# Patient Record
Sex: Male | Born: 1995 | Hispanic: Yes | Marital: Single | Smoking: Current every day smoker
Health system: Southern US, Community
[De-identification: ages and names within clinical notes are randomized; demographics above are authoritative.]

## PROBLEM LIST (undated history)

## (undated) DIAGNOSIS — J45909 Unspecified asthma, uncomplicated: Secondary | ICD-10-CM

---
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
## 2019-01-16 ENCOUNTER — Emergency Department
Admission: EM | Admit: 2019-01-16 | Discharge: 2019-01-16 | Disposition: A | Discharge status: Home / Self Care | Payer: Medicaid Other | Attending: Emergency Medicine | Admitting: Emergency Medicine

## 2019-01-16 ENCOUNTER — Other Ambulatory Visit: Payer: Self-pay

## 2019-01-16 ENCOUNTER — Emergency Department: Payer: Medicaid Other

## 2019-01-16 ENCOUNTER — Encounter: Payer: Self-pay | Admitting: Emergency Medicine

## 2019-01-16 DIAGNOSIS — R1010 Upper abdominal pain, unspecified: Secondary | ICD-10-CM

## 2019-01-16 DIAGNOSIS — R1013 Epigastric pain: Secondary | ICD-10-CM | POA: Diagnosis present

## 2019-01-16 DIAGNOSIS — K2900 Acute gastritis without bleeding: Secondary | ICD-10-CM | POA: Insufficient documentation

## 2019-01-16 DIAGNOSIS — J45909 Unspecified asthma, uncomplicated: Secondary | ICD-10-CM | POA: Diagnosis not present

## 2019-01-16 HISTORY — DX: Unspecified asthma, uncomplicated: J45.909

## 2019-01-16 LAB — LIPASE, BLOOD: Lipase: 22 U/L (ref 11–51)

## 2019-01-16 LAB — CBC
HCT: 47.5 % (ref 39.0–52.0)
Hemoglobin: 16.2 g/dL (ref 13.0–17.0)
MCH: 29 pg (ref 26.0–34.0)
MCHC: 34.1 g/dL (ref 30.0–36.0)
MCV: 85 fL (ref 80.0–100.0)
Platelets: 303 10*3/uL (ref 150–400)
RBC: 5.59 MIL/uL (ref 4.22–5.81)
RDW: 12.8 % (ref 11.5–15.5)
WBC: 10.8 10*3/uL — ABNORMAL HIGH (ref 4.0–10.5)
nRBC: 0 % (ref 0.0–0.2)

## 2019-01-16 LAB — COMPREHENSIVE METABOLIC PANEL
ALT: 74 U/L — ABNORMAL HIGH (ref 0–44)
AST: 57 U/L — ABNORMAL HIGH (ref 15–41)
Albumin: 4.9 g/dL (ref 3.5–5.0)
Alkaline Phosphatase: 93 U/L (ref 38–126)
Anion gap: 13 (ref 5–15)
BUN: 16 mg/dL (ref 6–20)
CO2: 22 mmol/L (ref 22–32)
Calcium: 9.4 mg/dL (ref 8.9–10.3)
Chloride: 101 mmol/L (ref 98–111)
Creatinine, Ser: 0.93 mg/dL (ref 0.61–1.24)
GFR calc Af Amer: >60 mL/min (ref >60)
GFR calc non Af Amer: >60 mL/min (ref >60)
Glucose, Bld: 133 mg/dL — ABNORMAL HIGH (ref 70–99)
Potassium: 3.6 mmol/L (ref 3.5–5.1)
Sodium: 136 mmol/L (ref 135–145)
Total Bilirubin: 1.6 mg/dL — ABNORMAL HIGH (ref 0.3–1.2)
Total Protein: 8.8 g/dL — ABNORMAL HIGH (ref 6.5–8.1)

## 2019-01-16 MED ORDER — MORPHINE SULFATE (PF) 2 MG/ML IV SOLN
INTRAVENOUS | Status: AC
  Filled 2019-01-16: qty 2

## 2019-01-16 MED ORDER — ONDANSETRON 4 MG PO TBDP: 4.0000 mg | ORAL_TABLET | Freq: Three times a day (TID) | ORAL | 0 refills | Status: AC | PRN

## 2019-01-16 MED ORDER — ONDANSETRON HCL 4 MG/2ML IJ SOLN
4.0000 mg | Freq: Once | INTRAMUSCULAR | Status: AC
  Administered 2019-01-16: 4 mg via INTRAVENOUS
  Filled 2019-01-16: qty 2

## 2019-01-16 MED ORDER — MORPHINE SULFATE (PF) 4 MG/ML IV SOLN
4.0000 mg | Freq: Once | INTRAVENOUS | Status: AC
  Administered 2019-01-16: 4 mg via INTRAVENOUS

## 2019-01-16 NOTE — ED Triage Notes (Signed)
Pt reports RUQ and LUQ pain, vomiting, headache since yesterday. Reports decreased appetite, also reports bilateral calf cramps. Denies diarrhea.

## 2019-01-16 NOTE — ED Notes (Signed)
AAOx3.  Skin warm and dry.  nad 

## 2019-01-16 NOTE — ED Provider Notes (Signed)
[facility] Emergency Department Provider Note   ____________________________________________    I have reviewed the triage vital signs and the nursing notes.   HISTORY  Chief Complaint Abdominal pain   HPI Dylan Snow is a 23 y.o. male who presents with complaints of burning/cramping moderate abdominal pain, primarily in the epigastrium which started yesterday.  He reports he had nausea and vomiting as well.  No diarrhea.  No fevers or chills.  No cough or shortness of breath.  Has never had this before.  Denies  recent alcohol ingestion.  No history of abdominal surgeries.  Does have a history of asthma.  Has not take anything for this.  Past Medical History:  Diagnosis Date  . Asthma     There are no active problems to display for this patient.     Prior to Admission medications   Medication Sig Start Date End Date Taking? Authorizing Provider  ondansetron (ZOFRAN ODT) 4 MG disintegrating tablet Take 1 tablet (4 mg total) by mouth every 8 (eight) hours as needed. 01/16/19   Jene EveryKinner, Chealsey Miyamoto, MD     Allergies Patient has no known allergies.  No family history on file.  Social History Occasional EtOH, no smoking Review of Systems  Constitutional: No fever/chills Eyes: No visual changes.  ENT: No sore throat. Cardiovascular: Denies chest pain. Respiratory: Denies shortness of breath. Gastrointestinal: As above Genitourinary: Negative for dysuria. Musculoskeletal: Negative for back pain. Skin: Negative for rash. Neurological: Negative for headaches or weakness   ____________________________________________   PHYSICAL EXAM:  VITAL SIGNS: ED Triage Vitals  Enc Vitals Group     BP 01/16/19 0916 (!) 155/74     Pulse Rate 01/16/19 0916 80     Resp 01/16/19 0916 15     Temp 01/16/19 0916 98.7 F (37.1 C)     Temp Source 01/16/19 0916 Oral     SpO2 01/16/19 0916 96 %     Weight 01/16/19 0917 99.8 kg (220 lb)     Height  01/16/19 0917 1.676 m (5\' 6" )     Head Circumference --      Peak Flow --      Pain Score 01/16/19 0916 4     Pain Loc --      Pain Edu? --      Excl. in GC? --     Constitutional: Alert and oriented.  Eyes: Conjunctivae are normal.   Nose: No congestion/rhinnorhea. Mouth/Throat: Mucous membranes are moist.   Cardiovascular: Normal rate, regular rhythm. Grossly normal heart sounds.  Good peripheral circulation. Respiratory: Normal respiratory effort.  No retractions. Lungs CTAB. Gastrointestinal: Mild tenderness to the right upper quadrant, no distention, no CVA tenderness Musculoskeletal: Warm and well perfused Neurologic:  Normal speech and language. No gross focal neurologic deficits are appreciated.  Skin:  Skin is warm, dry and intact. No rash noted. Psychiatric: Mood and affect are normal. Speech and behavior are normal.  ____________________________________________   LABS (all labs ordered are listed, but only abnormal results are displayed)  Labs Reviewed  COMPREHENSIVE METABOLIC PANEL - Abnormal; Notable for the following components:      Result Value   Glucose, Bld 133 (*)    Total Protein 8.8 (*)    AST 57 (*)    ALT 74 (*)    Total Bilirubin 1.6 (*)    All other components within normal limits  CBC - Abnormal; Notable for the following components:   WBC 10.8 (*)  All other components within normal limits  LIPASE, BLOOD  URINALYSIS, COMPLETE (UACMP) WITH MICROSCOPIC   ____________________________________________  EKG  ED ECG REPORT I, Lavonia Drafts, the attending physician, personally viewed and interpreted this ECG.  Date: 01/16/2019  Rhythm: normal sinus rhythm QRS Axis: normal Intervals: normal ST/T Wave abnormalities: normal Narrative Interpretation: no evidence of acute ischemia  ____________________________________________  RADIOLOGY  Ultrasound right upper quadrant no acute abnormality ____________________________________________    PROCEDURES  Procedure(s) performed: No  Procedures   Critical Care performed: No ____________________________________________   INITIAL IMPRESSION / ASSESSMENT AND PLAN / ED COURSE  Pertinent labs & imaging results that were available during my care of the patient were reviewed by me and considered in my medical decision making (see chart for details).  Patient presents with epigastric abdominal pain, mild tenderness in the right upper quadrant, differential includes biliary colic/cholecystitis, pancreatitis, gastritis..  Will treat with IV morphine, IV Zofran, obtain labs, ultrasound of the right upper quadrant and reevaluate.  Patient had significant provement after IV morphine, ultrasound is overall unremarkable, lab work is reassuring, he does have mild elevations in LFTs but admits to frequent alcohol use.  Counseled him on alcohol cessation.  Outpatient follow-up with PCP.    ____________________________________________   FINAL CLINICAL IMPRESSION(S) / ED DIAGNOSES  Final diagnoses:  Upper abdominal pain  Acute gastritis without hemorrhage, unspecified gastritis type        Note:  This document was prepared using Dragon voice recognition software and may include unintentional dictation errors.   Lavonia Drafts, MD 01/16/19 1308

## 2020-04-15 ENCOUNTER — Telehealth: Payer: Self-pay | Admitting: Emergency Medicine

## 2020-04-15 ENCOUNTER — Other Ambulatory Visit: Payer: Self-pay

## 2020-04-15 ENCOUNTER — Emergency Department
Admission: EM | Admit: 2020-04-15 | Discharge: 2020-04-15 | Disposition: A | Discharge status: Home / Self Care | Payer: Medicaid Other | Attending: Emergency Medicine | Admitting: Emergency Medicine

## 2020-04-15 ENCOUNTER — Encounter: Payer: Self-pay | Admitting: Emergency Medicine

## 2020-04-15 DIAGNOSIS — N50819 Testicular pain, unspecified: Secondary | ICD-10-CM | POA: Diagnosis present

## 2020-04-15 DIAGNOSIS — F17200 Nicotine dependence, unspecified, uncomplicated: Secondary | ICD-10-CM | POA: Diagnosis not present

## 2020-04-15 DIAGNOSIS — J45909 Unspecified asthma, uncomplicated: Secondary | ICD-10-CM | POA: Insufficient documentation

## 2020-04-15 DIAGNOSIS — A64 Unspecified sexually transmitted disease: Secondary | ICD-10-CM

## 2020-04-15 LAB — URINALYSIS, COMPLETE (UACMP) WITH MICROSCOPIC
Bacteria, UA: NONE SEEN
Bilirubin Urine: NEGATIVE
Glucose, UA: NEGATIVE mg/dL
Hgb urine dipstick: NEGATIVE
Ketones, ur: NEGATIVE mg/dL
Nitrite: NEGATIVE
Protein, ur: NEGATIVE mg/dL
Specific Gravity, Urine: 1.02 (ref 1.005–1.030)
Squamous Epithelial / HPF: NONE SEEN (ref 0–5)
pH: 5 (ref 5.0–8.0)

## 2020-04-15 LAB — CHLAMYDIA/NGC RT PCR (ARMC ONLY)
Chlamydia Tr: DETECTED — AB
N gonorrhoeae: NOT DETECTED

## 2020-04-15 MED ORDER — CEFTRIAXONE SODIUM 1 G IJ SOLR
500.0000 mg | Freq: Once | INTRAMUSCULAR | Status: AC
  Administered 2020-04-15: 500 mg via INTRAMUSCULAR
  Filled 2020-04-15: qty 10

## 2020-04-15 MED ORDER — DOXYCYCLINE HYCLATE 100 MG PO TABS
100.0000 mg | ORAL_TABLET | Freq: Once | ORAL | Status: AC
  Administered 2020-04-15: 100 mg via ORAL
  Filled 2020-04-15: qty 1

## 2020-04-15 MED ORDER — DOXYCYCLINE HYCLATE 100 MG PO TABS: 100.0000 mg | ORAL_TABLET | Freq: Two times a day (BID) | ORAL | 0 refills | Status: AC

## 2020-04-15 NOTE — ED Notes (Signed)
Patient declined discharge vital signs. 

## 2020-04-15 NOTE — ED Provider Notes (Signed)
[facility] Emergency Department Provider Note   ____________________________________________    I have reviewed the triage vital signs and the nursing notes.   HISTORY  Chief Complaint Abscess and Testicle Pain     HPI Dylan Snow is a 24 y.o. male who presents with complaints of dysuria and mild left testicular pain.  Patient reports he thought it could be from "partying "too much and drinking too much alcohol so he has started drinking cranberry juice.  No scrotal swelling, he reports a small area of tenderness on his left testicle.  No fevers chills nausea or vomiting  Past Medical History:  Diagnosis Date  . Asthma     There are no problems to display for this patient.   History reviewed. No pertinent surgical history.  Prior to Admission medications   Medication Sig Start Date End Date Taking? Authorizing Provider  doxycycline (VIBRA-TABS) 100 MG tablet Take 1 tablet (100 mg total) by mouth 2 (two) times daily. 04/15/20   Jene Every, MD  ondansetron (ZOFRAN ODT) 4 MG disintegrating tablet Take 1 tablet (4 mg total) by mouth every 8 (eight) hours as needed. 01/16/19   Jene Every, MD     Allergies Patient has no known allergies.  No family history on file.  Social History Social History   Tobacco Use  . Smoking status: Current Every Day Smoker  . Smokeless tobacco: Never Used  Substance Use Topics  . Alcohol use: Yes    Comment: not in 2 weeks  . Drug use: Not on file    Review of Systems  Constitutional: No fever/chills Eyes: No visual changes.  ENT: No sore throat. Cardiovascular: Denies chest pain. Respiratory: Denies shortness of breath. Gastrointestinal: As above Genitourinary: Penile discharge, dysuria as above Musculoskeletal: Negative for back pain. Skin: Negative for rash. Neurological: Negative for headaches    ____________________________________________   PHYSICAL EXAM:  VITAL SIGNS: ED  Triage Vitals  Enc Vitals Group     BP 04/15/20 1136 (!) 115/51     Pulse Rate 04/15/20 1136 63     Resp 04/15/20 1136 14     Temp 04/15/20 1136 98.4 F (36.9 C)     Temp Source 04/15/20 1136 Oral     SpO2 04/15/20 1136 97 %     Weight 04/15/20 1137 102.1 kg (225 lb)     Height 04/15/20 1137 1.676 m (5\' 6" )     Head Circumference --      Peak Flow --      Pain Score 04/15/20 1137 8     Pain Loc --      Pain Edu? --      Excl. in GC? --     Constitutional: Alert and oriented. No acute distress. Pleasant and interactive   Mouth/Throat: Mucous membranes are moist.    Cardiovascular: Normal rate, regular rhythm.   Good peripheral circulation. Respiratory: Normal respiratory effort.  No retractions Gastrointestinal: Soft and nontender. No distention.  Genitourinary: Mildly tender epididymis on the left, no scrotal swelling, positive penile discharge Musculoskeletal:   Warm and well perfused Neurologic:  Normal speech and language. No gross focal neurologic deficits are appreciated.  Skin:  Skin is warm, dry and intact. No rash noted. Psychiatric: Mood and affect are normal. Speech and behavior are normal.  ____________________________________________   LABS (all labs ordered are listed, but only abnormal results are displayed)  Labs Reviewed  CHLAMYDIA/NGC RT PCR ([facility] ONLY)  URINALYSIS, COMPLETE (UACMP) WITH MICROSCOPIC  ____________________________________________  EKG  None ____________________________________________  RADIOLOGY  None ____________________________________________   PROCEDURES  Procedure(s) performed: No  Procedures   Critical Care performed: No ____________________________________________   INITIAL IMPRESSION / ASSESSMENT AND PLAN / ED COURSE  Pertinent labs & imaging results that were available during my care of the patient were reviewed by me and considered in my medical decision making (see chart for details).  Patient  presents with penile discharge, dysuria, likely mild epididymitis on the left consistent with sexually transmitted disease, will treat with IM Rocephin, p.o. doxy, no sexual activity x1 week, notify sexual partners.    ____________________________________________   FINAL CLINICAL IMPRESSION(S) / ED DIAGNOSES  Final diagnoses:  STD (male)        Note:  This document was prepared using Dragon voice recognition software and may include unintentional dictation errors.   Jene Every, MD 04/15/20 1302

## 2020-04-15 NOTE — Telephone Encounter (Signed)
Called patient to inform of std test positive for chlamydia.  No answer and no voicemail.

## 2020-04-15 NOTE — ED Triage Notes (Signed)
Says he has left groin/testicle pian and it has a bump on it.  Started last night.

## 2020-04-18 NOTE — Telephone Encounter (Signed)
Called patient again to give std results.  Again, no answer and no voicemail.  Will send letter.

## 2020-07-24 IMAGING — US ULTRASOUND ABDOMEN LIMITED
1 series · 14 of 25 positions shown · non-contrast
Comparison: None.

CLINICAL DATA: RIGHT upper quadrant pain and vomiting since
yesterday.

EXAM:
ULTRASOUND ABDOMEN LIMITED RIGHT UPPER QUADRANT

[Series 1: ultrasound abdomen limited · 0.26mm/px · 14 of 38 slices shown]
[im 1/38]
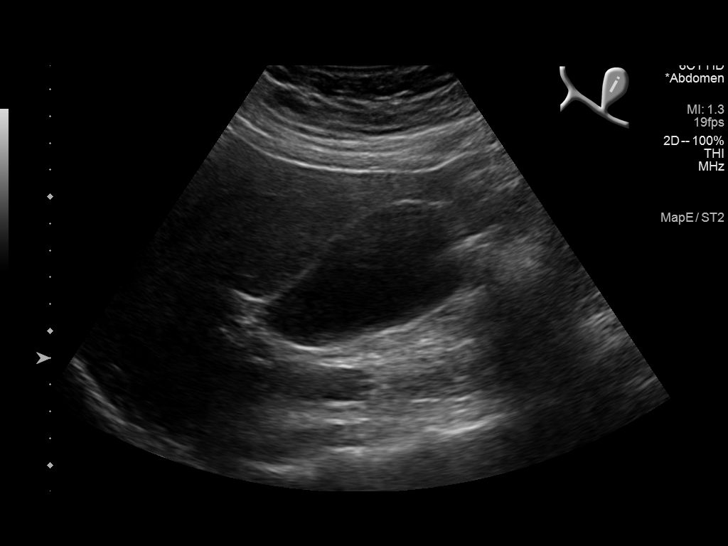
[im 4/38]
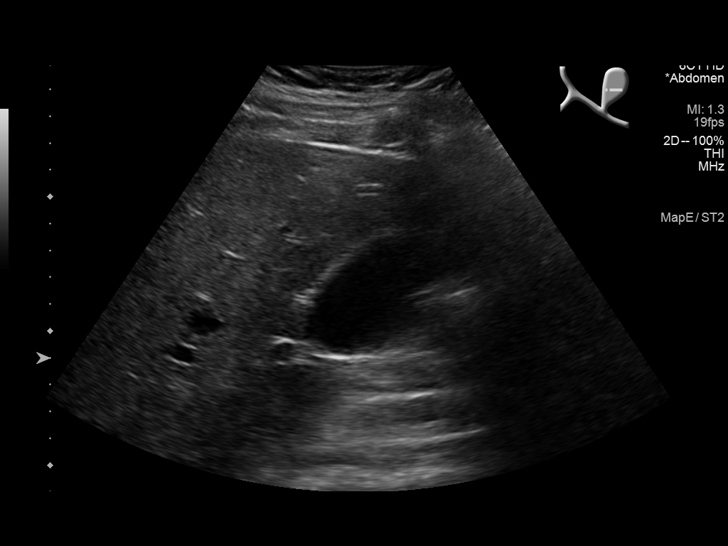
[im 7/38]
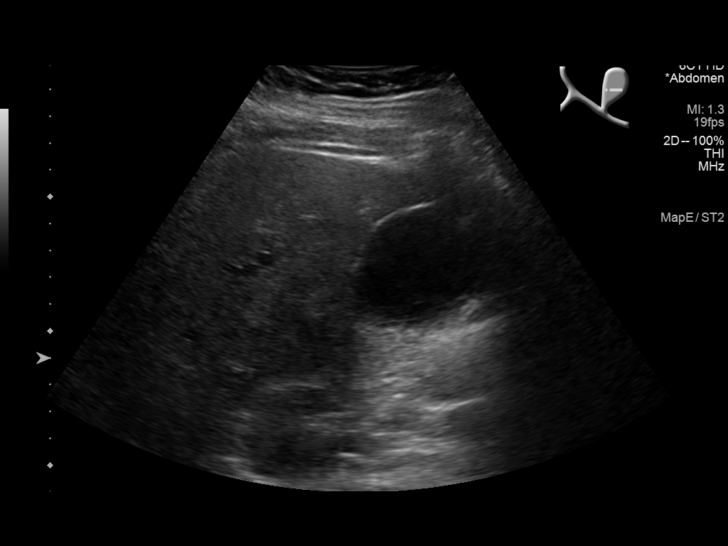
[im 10/38]
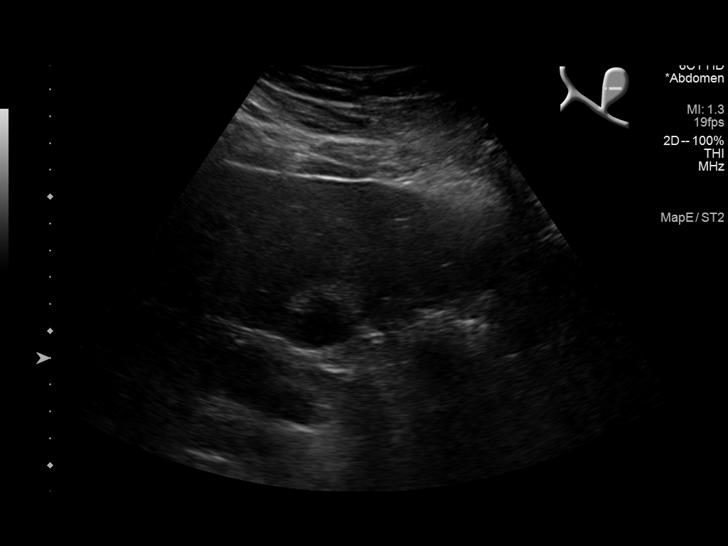
[im 13/38]
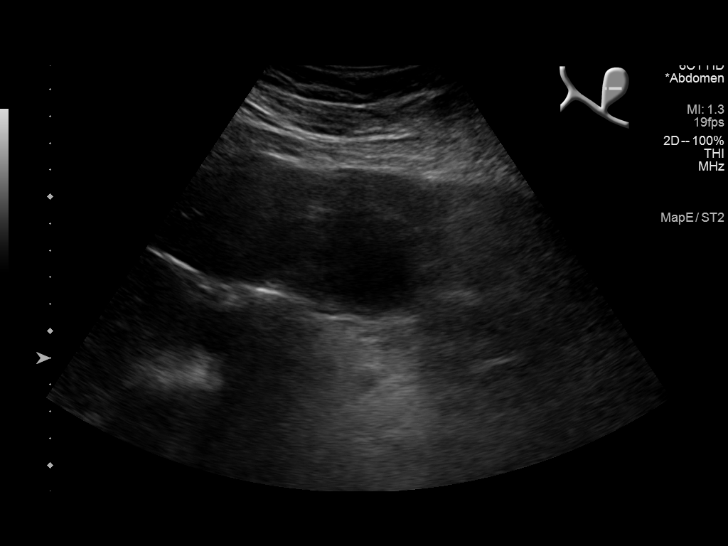
[im 14/38]
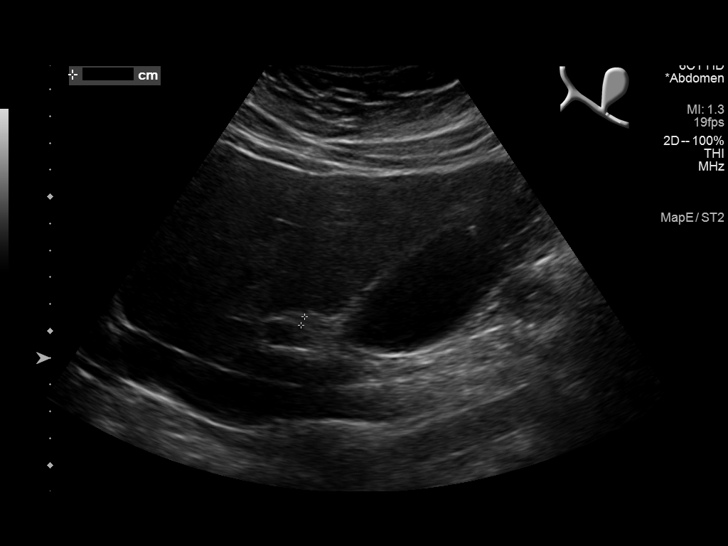
[im 17/38]
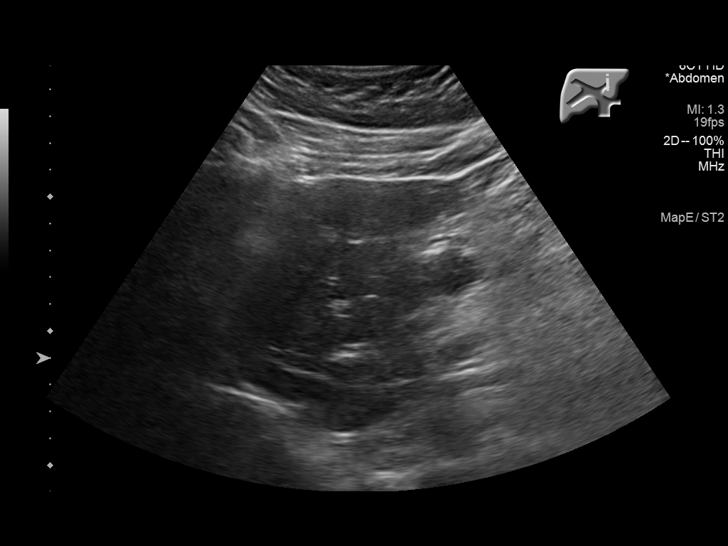
[im 21/38]
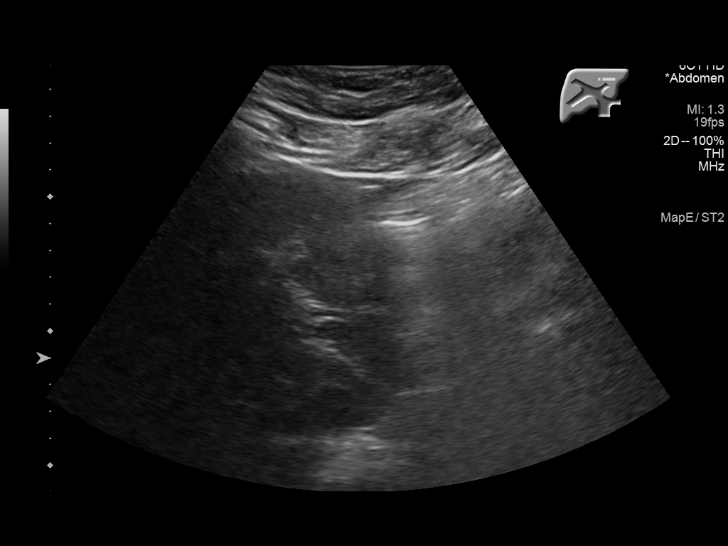
[im 24/38]
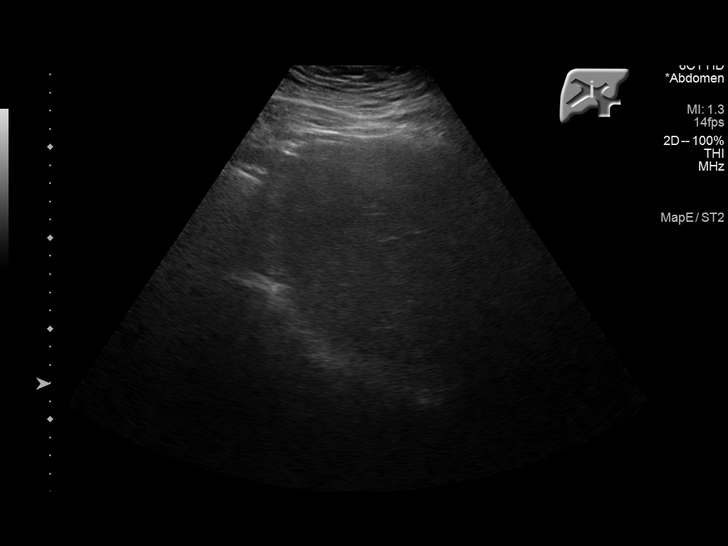
[im 25/38]
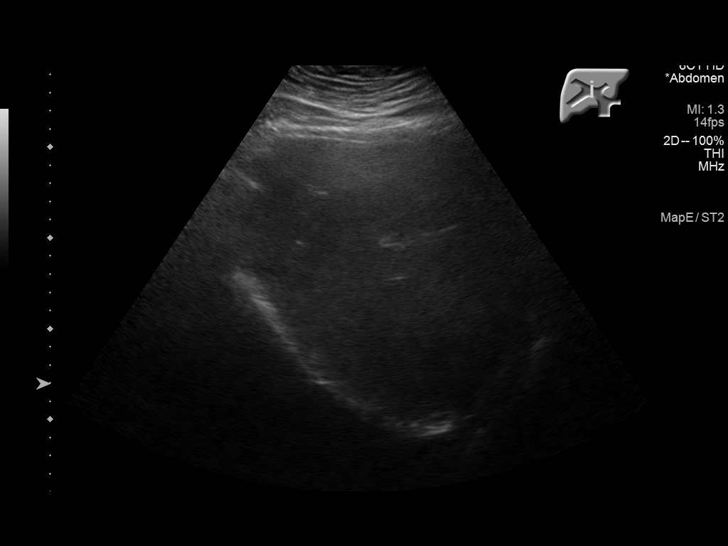
[im 28/38]
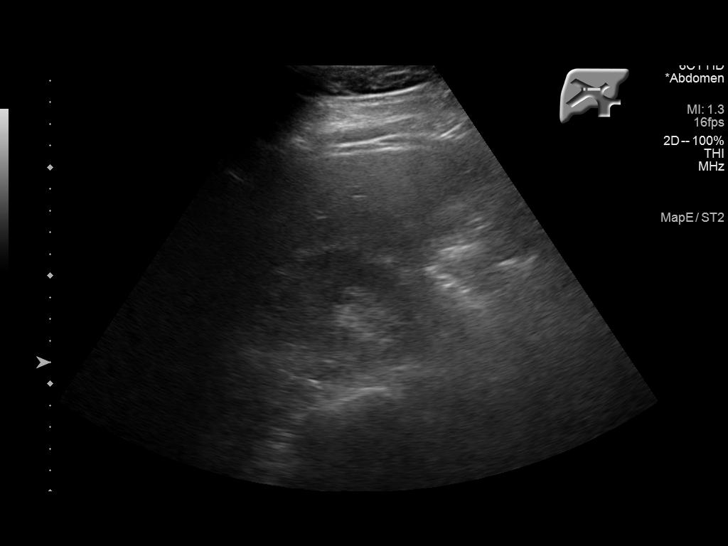
[im 31/38]
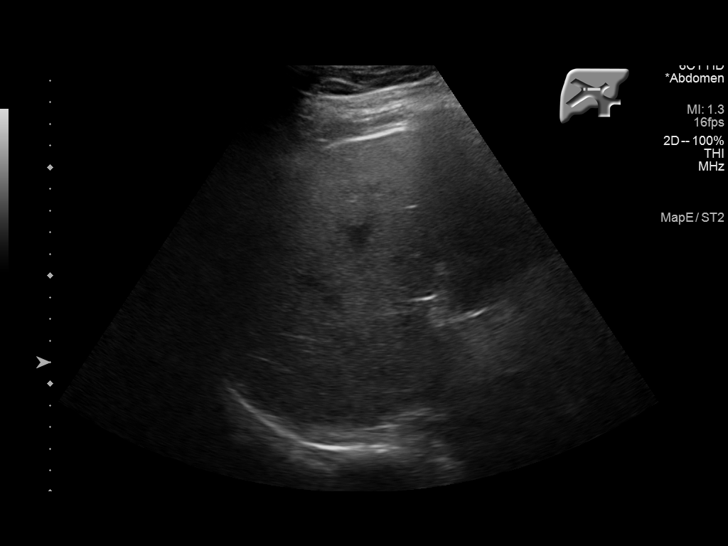
[im 34/38]
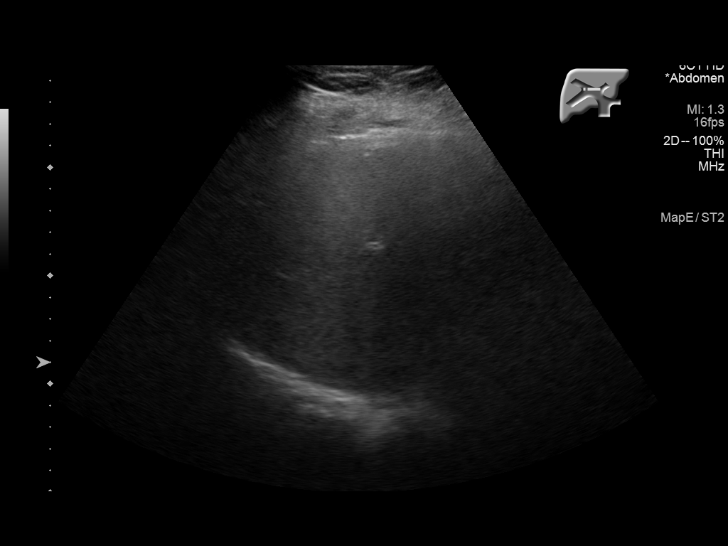
[im 38/38]
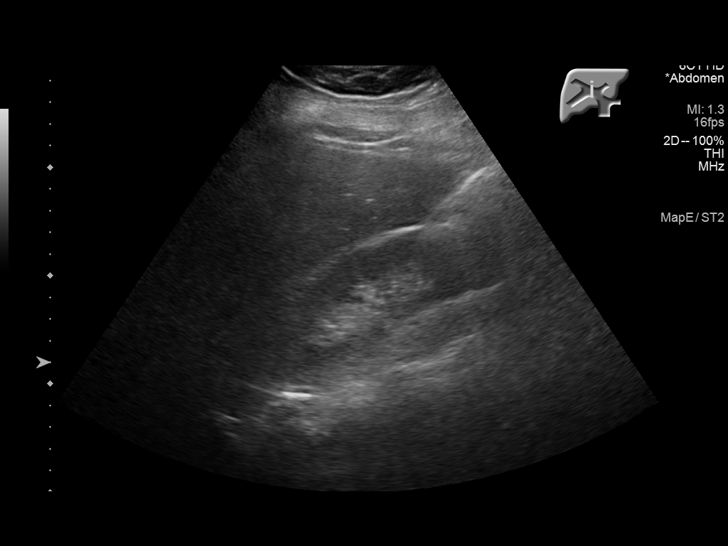

[14 of 25 positions shown; findings below may reference images not displayed]

FINDINGS: Gallbladder:

No gallstones or wall thickening visualized. No sonographic Murphy
sign noted by sonographer.

Common bile duct:

Diameter: 3 mm

Liver:

Liver is difficult to penetrate suggesting fatty infiltration. No
focal liver lesion identified, although characterization is somewhat
limited by the poor penetration. Portal vein is patent on color
Doppler imaging with normal direction of blood flow towards the
liver.
IMPRESSION: 1. No acute findings. No evidence of cholecystitis. No bile duct
dilatation.
2. Probable fatty infiltration of the liver.

## 2021-02-10 ENCOUNTER — Other Ambulatory Visit: Payer: Self-pay

## 2021-02-10 ENCOUNTER — Emergency Department
Admission: EM | Admit: 2021-02-10 | Discharge: 2021-02-10 | Disposition: A | Discharge status: Home / Self Care | Payer: Medicaid Other | Attending: Emergency Medicine | Admitting: Emergency Medicine

## 2021-02-10 ENCOUNTER — Encounter: Payer: Self-pay | Admitting: Emergency Medicine

## 2021-02-10 DIAGNOSIS — Y92009 Unspecified place in unspecified non-institutional (private) residence as the place of occurrence of the external cause: Secondary | ICD-10-CM | POA: Diagnosis not present

## 2021-02-10 DIAGNOSIS — W01198A Fall on same level from slipping, tripping and stumbling with subsequent striking against other object, initial encounter: Secondary | ICD-10-CM | POA: Insufficient documentation

## 2021-02-10 DIAGNOSIS — S8992XA Unspecified injury of left lower leg, initial encounter: Secondary | ICD-10-CM | POA: Diagnosis present

## 2021-02-10 DIAGNOSIS — Z5321 Procedure and treatment not carried out due to patient leaving prior to being seen by health care provider: Secondary | ICD-10-CM | POA: Diagnosis not present

## 2021-02-10 DIAGNOSIS — S81012A Laceration without foreign body, left knee, initial encounter: Secondary | ICD-10-CM | POA: Diagnosis not present

## 2021-02-10 NOTE — ED Triage Notes (Signed)
Pt reports that the floor was wet at his house and he slid hitting his left knee into the air conditioner causing a laceration on his knee. This happened two days go. Pt states that he thinks that it is infected. No redness or swelling seen.

## 2021-02-16 ENCOUNTER — Emergency Department
Admission: EM | Admit: 2021-02-16 | Discharge: 2021-02-16 | Disposition: A | Discharge status: Home / Self Care | Payer: Medicaid Other | Attending: Emergency Medicine | Admitting: Emergency Medicine

## 2021-02-16 ENCOUNTER — Other Ambulatory Visit: Payer: Self-pay

## 2021-02-16 ENCOUNTER — Encounter: Payer: Self-pay | Admitting: Emergency Medicine

## 2021-02-16 DIAGNOSIS — W268XXA Contact with other sharp object(s), not elsewhere classified, initial encounter: Secondary | ICD-10-CM | POA: Diagnosis not present

## 2021-02-16 DIAGNOSIS — F17200 Nicotine dependence, unspecified, uncomplicated: Secondary | ICD-10-CM | POA: Diagnosis not present

## 2021-02-16 DIAGNOSIS — S81012A Laceration without foreign body, left knee, initial encounter: Secondary | ICD-10-CM | POA: Insufficient documentation

## 2021-02-16 MED ORDER — SULFAMETHOXAZOLE-TRIMETHOPRIM 800-160 MG PO TABS: 1.0000 | ORAL_TABLET | Freq: Once | ORAL | Status: DC

## 2021-02-16 MED ORDER — LIDOCAINE-EPINEPHRINE 1 %-1:100000 IJ SOLN: 30.0000 mL | Freq: Once | INTRAMUSCULAR | Status: DC

## 2021-02-16 NOTE — ED Triage Notes (Signed)
8 days ago pt slipped on wet floor, slid hitting his left knee into the air conditioner. He states that he feels that some of his skin is dying and the other is growing back weird.

## 2021-02-16 NOTE — ED Provider Notes (Signed)
[facility] EMERGENCY DEPARTMENT Provider Note   CSN: [id number] Arrival date & time: 02/16/21  1744     History Chief Complaint  Patient presents with   Leg Injury    Dylan Snow is a 25 y.o. male presents to the emergency department evaluation of laceration to the left leg that occurred 8 days ago.  Patient states he cut the knee on a piece of clean metal. patient suffered a very superficial circular flap laceration to the anterior knee.  He has a partially dead and dry piece of tissue that is partially attached and does not appear to be healing.  He has no joint pain, skin pain, warmth redness or drainage.    HPI     Past Medical History:  Diagnosis Date   Asthma     There are no problems to display for this patient.   No past surgical history on file.     No family history on file.  Social History   Tobacco Use   Smoking status: Every Day   Smokeless tobacco: Never  Substance Use Topics   Alcohol use: Yes    Comment: not in 2 weeks    Home Medications Prior to Admission medications   Medication Sig Start Date End Date Taking? Authorizing Provider  doxycycline (VIBRA-TABS) 100 MG tablet Take 1 tablet (100 mg total) by mouth 2 (two) times daily. 04/15/20   Jene Every, MD  ondansetron (ZOFRAN ODT) 4 MG disintegrating tablet Take 1 tablet (4 mg total) by mouth every 8 (eight) hours as needed. 01/16/19   Jene Every, MD    Allergies    Patient has no known allergies.  Review of Systems   Review of Systems  Constitutional:  Negative for fever.  Musculoskeletal:  Negative for arthralgias, gait problem and myalgias.  Skin:  Negative for rash and wound.   Physical Exam Updated Vital Signs BP (!) 142/64 (BP Location: Left Arm)   Pulse 96   Temp 98.2 F (36.8 C) (Oral)   Resp 16   Ht 5\' 6"  (1.676 m)   Wt 101.6 kg   SpO2 96%   BMI 36.15 kg/m   Physical Exam Constitutional:      Appearance: He is well-developed.   HENT:     Head: Normocephalic and atraumatic.  Eyes:     Conjunctiva/sclera: Conjunctivae normal.  Cardiovascular:     Rate and Rhythm: Normal rate.  Pulmonary:     Effort: Pulmonary effort is normal. No respiratory distress.  Musculoskeletal:        General: Normal range of motion.     Cervical back: Normal range of motion.     Comments: Left knee with normal range of motion.  No swelling.  No anterior tenderness.  Round 2.5 cm diameter superficial laceration with a flap still partially intact but appears to be very swollen and with black edges and eschar tissue.  No significant signs of infection.  No signs of joint penetration nor joint infection    Skin:    General: Skin is warm.     Findings: No rash.  Neurological:     Mental Status: He is alert and oriented to person, place, and time.  Psychiatric:        Behavior: Behavior normal.        Thought Content: Thought content normal.    ED Results / Procedures / Treatments   Labs (all labs ordered are listed, but only abnormal results are displayed) Labs Reviewed -  No data to display  EKG None  Radiology No results found.  Procedures Procedures   Medications Ordered in ED Medications  sulfamethoxazole-trimethoprim (BACTRIM DS) 800-160 MG per tablet 1 tablet (has no administration in time range)  lidocaine-EPINEPHrine (XYLOCAINE W/EPI) 1 %-1:100000 (with pres) injection 30 mL (has no administration in time range)    ED Course  I have reviewed the triage vital signs and the nursing notes.  Pertinent labs & imaging results that were available during my care of the patient were reviewed by me and considered in my medical decision making (see chart for details).    MDM Rules/Calculators/A&P                           25 year old male with anterior knee laceration, superficial with a skin flap that does not appear to be healing.  Skin flap is quite large, 2.5 cm in diameter and patient is requesting that this be  excised which does sound to be reasonable and the best option to help his wound heal quicker.  Discussed placing patient on antibiotics and updating his tetanus.  Unfortunately orders were placed but patient left due to an emergency without discussing with provider.  Patient stated he would come back tomorrow.   Final Clinical Impression(s) / ED Diagnoses Final diagnoses:  Knee laceration, left, initial encounter    Rx / DC Orders ED Discharge Orders     None        Ronnette Juniper 02/16/21 Molli Knock, MD 02/21/21 [phone]

## 2021-02-16 NOTE — ED Notes (Signed)
Patient reports he needs to leave. Provider aware. Patient leaving at this time.
